# Patient Record
Sex: Female | Born: 1980 | Race: Asian | Hispanic: No | Marital: Married | Smoking: Never smoker
Health system: Southern US, Community
[De-identification: ages and names within clinical notes are randomized; demographics above are authoritative.]

## PROBLEM LIST (undated history)

## (undated) ENCOUNTER — Inpatient Hospital Stay (HOSPITAL_COMMUNITY): Payer: Self-pay

## (undated) DIAGNOSIS — D259 Leiomyoma of uterus, unspecified: Secondary | ICD-10-CM

## (undated) HISTORY — PX: OVARY SURGERY: SHX727

---

## 2016-01-02 ENCOUNTER — Encounter (HOSPITAL_COMMUNITY): Payer: Self-pay | Admitting: *Deleted

## 2016-01-02 ENCOUNTER — Inpatient Hospital Stay (HOSPITAL_COMMUNITY)
Admission: AD | Admit: 2016-01-02 | Discharge: 2016-01-02 | Disposition: A | Discharge status: Home / Self Care | Payer: Managed Care, Other (non HMO) | Source: Ambulatory Visit | Attending: Family Medicine | Admitting: Family Medicine

## 2016-01-02 ENCOUNTER — Inpatient Hospital Stay (HOSPITAL_COMMUNITY): Payer: Managed Care, Other (non HMO)

## 2016-01-02 DIAGNOSIS — O3680X0 Pregnancy with inconclusive fetal viability, not applicable or unspecified: Secondary | ICD-10-CM

## 2016-01-02 DIAGNOSIS — O26891 Other specified pregnancy related conditions, first trimester: Secondary | ICD-10-CM | POA: Diagnosis not present

## 2016-01-02 DIAGNOSIS — Z3A01 Less than 8 weeks gestation of pregnancy: Secondary | ICD-10-CM | POA: Insufficient documentation

## 2016-01-02 DIAGNOSIS — O3481 Maternal care for other abnormalities of pelvic organs, first trimester: Secondary | ICD-10-CM | POA: Diagnosis not present

## 2016-01-02 DIAGNOSIS — O26899 Other specified pregnancy related conditions, unspecified trimester: Secondary | ICD-10-CM

## 2016-01-02 DIAGNOSIS — E860 Dehydration: Secondary | ICD-10-CM

## 2016-01-02 DIAGNOSIS — R109 Unspecified abdominal pain: Secondary | ICD-10-CM

## 2016-01-02 DIAGNOSIS — N83201 Unspecified ovarian cyst, right side: Secondary | ICD-10-CM | POA: Diagnosis not present

## 2016-01-02 HISTORY — DX: Leiomyoma of uterus, unspecified: D25.9

## 2016-01-02 LAB — URINALYSIS, ROUTINE W REFLEX MICROSCOPIC
BILIRUBIN URINE: NEGATIVE
Glucose, UA: NEGATIVE mg/dL
HGB URINE DIPSTICK: NEGATIVE
Leukocytes, UA: NEGATIVE
Nitrite: NEGATIVE
PH: 5.5 (ref 5.0–8.0)
Protein, ur: NEGATIVE mg/dL

## 2016-01-02 LAB — CBC
HEMATOCRIT: 34.4 % — AB (ref 36.0–46.0)
Hemoglobin: 12 g/dL (ref 12.0–15.0)
MCH: 30.8 pg (ref 26.0–34.0)
MCHC: 34.9 g/dL (ref 30.0–36.0)
MCV: 88.2 fL (ref 78.0–100.0)
PLATELETS: 306 10*3/uL (ref 150–400)
RBC: 3.9 MIL/uL (ref 3.87–5.11)
RDW: 12.8 % (ref 11.5–15.5)
WBC: 6.7 10*3/uL (ref 4.0–10.5)

## 2016-01-02 LAB — POCT PREGNANCY, URINE: Preg Test, Ur: POSITIVE — AB

## 2016-01-02 LAB — HCG, QUANTITATIVE, PREGNANCY: hCG, Beta Chain, Quant, S: 615 m[IU]/mL — ABNORMAL HIGH (ref <5)

## 2016-01-02 MED ORDER — TRAMADOL HCL 50 MG PO TABS
50.0000 mg | ORAL_TABLET | Freq: Once | ORAL | Status: AC
  Administered 2016-01-02: 50 mg via ORAL
  Filled 2016-01-02: qty 1

## 2016-01-02 MED ORDER — LACTATED RINGERS IV BOLUS (SEPSIS)
1000.0000 mL | Freq: Once | INTRAVENOUS | Status: AC
  Administered 2016-01-02: 1000 mL via INTRAVENOUS

## 2016-01-02 NOTE — MAU Provider Note (Signed)
History     CSN: [id number]  Arrival date and time: 01/02/16 1654   First Provider Initiated Contact with Patient 01/02/16 1750      Chief Complaint  Patient presents with  . Abdominal Pain   HPI    [employer] interpretor use: patient is [place] speaking.   [name] is a 35 y.o. female G1P0 @ [redacted]w[redacted]d here with abdominal pain. The pain started 2 weeks ago. The pain comes and goes. The pain is located in the middle of her lower abdomen and in her RLQ. The patient was seen by [facility] today and was sent here for further evaluation.  She denies vaginal bleeding.   Patient rates her pain 7/10  OB History    Gravida Para Term Preterm AB Living   1             SAB TAB Ectopic Multiple Live Births                  Past Medical History:  Diagnosis Date  . Fibroid, uterine     Past Surgical History:  Procedure Laterality Date  . OVARY SURGERY      No family history on file.  Social History  Substance Use Topics  . Smoking status: Never Smoker  . Smokeless tobacco: Never Used  . Alcohol use No    Allergies: Allergies not on file  No prescriptions prior to admission.   Results for orders placed or performed during the hospital encounter of 01/02/16 (from the past 48 hour(s))  Urinalysis, Routine w reflex microscopic (not at [facility])     Status: Abnormal   Collection Time: 01/02/16  5:15 PM  Result Value Ref Range   Color, Urine YELLOW YELLOW   APPearance CLEAR CLEAR   Specific Gravity, Urine >1.030 (H) 1.005 - 1.030   pH 5.5 5.0 - 8.0   Glucose, UA NEGATIVE NEGATIVE mg/dL   Hgb urine dipstick NEGATIVE NEGATIVE   Bilirubin Urine NEGATIVE NEGATIVE   Ketones, ur >80 (A) NEGATIVE mg/dL   Protein, ur NEGATIVE NEGATIVE mg/dL   Nitrite NEGATIVE NEGATIVE   Leukocytes, UA NEGATIVE NEGATIVE    Comment: MICROSCOPIC NOT DONE ON URINES WITH NEGATIVE PROTEIN, BLOOD, LEUKOCYTES, NITRITE, OR GLUCOSE <1000 mg/dL.  Pregnancy, urine POC     Status: Abnormal    Collection Time: 01/02/16  5:23 PM  Result Value Ref Range   Preg Test, Ur POSITIVE (A) NEGATIVE    Comment:        THE SENSITIVITY OF THIS METHODOLOGY IS >24 mIU/mL   CBC     Status: Abnormal   Collection Time: 01/02/16  6:01 PM  Result Value Ref Range   WBC 6.7 4.0 - 10.5 K/uL   RBC 3.90 3.87 - 5.11 MIL/uL   Hemoglobin 12.0 12.0 - 15.0 g/dL   HCT 34.4 (L) 36.0 - 46.0 %   MCV 88.2 78.0 - 100.0 fL   MCH 30.8 26.0 - 34.0 pg   MCHC 34.9 30.0 - 36.0 g/dL   RDW 12.8 11.5 - 15.5 %   Platelets 306 150 - 400 K/uL  ABO/Rh     Status: None (Preliminary result)   Collection Time: 01/02/16  6:01 PM  Result Value Ref Range   ABO/RH(D) O POS   hCG, quantitative, pregnancy     Status: Abnormal   Collection Time: 01/02/16  6:01 PM  Result Value Ref Range   hCG, Beta Chain, Quant, S 615 (H) <5 mIU/mL    Comment:  GEST. AGE      CONC.  (mIU/mL)   <=1 WEEK        5 - 50     2 WEEKS       50 - 500     3 WEEKS       100 - 10,000     4 WEEKS     1,000 - 30,000     5 WEEKS     3,500 - 115,000   6-8 WEEKS     12,000 - 270,000    12 WEEKS     15,000 - 220,000        FEMALE AND NON-PREGNANT FEMALE:     LESS THAN 5 mIU/mL    US Ob Comp Less 14 Wks  Result Date: 01/02/2016 CLINICAL DATA:  Pelvic pain on the right EXAM: OBSTETRIC <14 WK [place] AND TRANSVAGINAL OB US TECHNIQUE: Both transabdominal and transvaginal ultrasound examinations were performed for complete evaluation of the gestation as well as the maternal uterus, adnexal regions, and pelvic cul-de-sac. Transvaginal technique was performed to assess early pregnancy. COMPARISON:  None. FINDINGS: Intrauterine gestational sac: Absent Maternal uterus/adnexae: Endometrial thickening is noted consistent with positive pregnancy test. The left ovary is within normal limits. The right ovary is enlarged with enlarged 3.5 cm cyst within. IMPRESSION: Prominent right ovarian cyst. Thickening of the endometrium consistent with a positive pregnancy test  although no gestational sac is noted at this time. Recommend follow-up quantitative B-HCG levels and follow-up US in 14 days as clinically indicated. This recommendation follows SRU consensus guidelines: Diagnostic Criteria for Nonviable Pregnancy Early in the First Trimester. [place] Med [id number]:705707. Electronically Signed   By: Inez Catalina M.D.   On: 01/02/2016 19:53   US Ob Transvaginal  Result Date: 01/02/2016 CLINICAL DATA:  Pelvic pain on the right EXAM: OBSTETRIC <14 WK [place] AND TRANSVAGINAL OB US TECHNIQUE: Both transabdominal and transvaginal ultrasound examinations were performed for complete evaluation of the gestation as well as the maternal uterus, adnexal regions, and pelvic cul-de-sac. Transvaginal technique was performed to assess early pregnancy. COMPARISON:  None. FINDINGS: Intrauterine gestational sac: Absent Maternal uterus/adnexae: Endometrial thickening is noted consistent with positive pregnancy test. The left ovary is within normal limits. The right ovary is enlarged with enlarged 3.5 cm cyst within. IMPRESSION: Prominent right ovarian cyst. Thickening of the endometrium consistent with a positive pregnancy test although no gestational sac is noted at this time. Recommend follow-up quantitative B-HCG levels and follow-up US in 14 days as clinically indicated. This recommendation follows SRU consensus guidelines: Diagnostic Criteria for Nonviable Pregnancy Early in the First Trimester. [employer] Med [id number]. Electronically Signed   By: Inez Catalina M.D.   On: 01/02/2016 19:53    Review of Systems  Constitutional: Negative for chills and fever.  Gastrointestinal: Positive for abdominal pain. Negative for constipation, nausea and vomiting.  Genitourinary: Negative for dysuria.   Physical Exam   Blood pressure 122/75, pulse (!) 56, temperature 97.4 F (36.3 C), temperature source Oral, resp. rate 16, last menstrual period 11/22/2015.  Physical Exam   Constitutional: She is oriented to person, place, and time. She appears well-developed and well-nourished. No distress.  GI: Soft. Normal appearance. There is tenderness in the right lower quadrant and suprapubic area. There is no rigidity, no rebound and no guarding.  Musculoskeletal: Normal range of motion.  Neurological: She is alert and oriented to person, place, and time.  Skin: She is not diaphoretic.    MAU  Course  Procedures  None  MDM  Urine shows >80 ketones LR bolus ordered US CBC, Quant, and ABO  Tramadol 50 mg PO given  Discussed [place] and labs results with the patient using [place] interpretor   Report given to [place] who resumes care of the patient.   Assessment and Plan   A:  1. Pregnancy of unknown anatomic location   2. Abdominal pain in pregnancy, antepartum   3. Severe dehydration   4. Cyst of right ovary    DC home Comfort measures reviewed  1st Trimester precautions  Bleeding precautions Ectopic precautions RX: none  Return to MAU as needed FU with OB as planned  [facility] .   Specialty:  Obstetrics and Gynecology Why:  01/04/16 at 0800  Contact information: [facility] [place] [phone]

## 2016-01-02 NOTE — Discharge Instructions (Signed)
First Trimester of Pregnancy The first trimester of pregnancy is from week 1 until the end of week 12 (months 1 through 3). A week after a sperm fertilizes an egg, the egg will implant on the wall of the uterus. This embryo will begin to develop into a baby. Genes from you and your partner are forming the baby. The female genes determine whether the baby is a boy or a girl. At 6-8 weeks, the eyes and face are formed, and the heartbeat can be seen on ultrasound. At the end of 12 weeks, all the baby's organs are formed.  Now that you are pregnant, you will want to do everything you can to have a healthy baby. Two of the most important things are to get good prenatal care and to follow your health care provider's instructions. Prenatal care is all the medical care you receive before the baby's birth. This care will help prevent, find, and treat any problems during the pregnancy and childbirth. BODY CHANGES Your body goes through many changes during pregnancy. The changes vary from woman to woman.   You may gain or lose a couple of pounds at first.  You may feel sick to your stomach (nauseous) and throw up (vomit). If the vomiting is uncontrollable, call your health care provider.  You may tire easily.  You may develop headaches that can be relieved by medicines approved by your health care provider.  You may urinate more often. Painful urination may mean you have a bladder infection.  You may develop heartburn as a result of your pregnancy.  You may develop constipation because certain hormones are causing the muscles that push waste through your intestines to slow down.  You may develop hemorrhoids or swollen, bulging veins (varicose veins).  Your breasts may begin to grow larger and become tender. Your nipples may stick out more, and the tissue that surrounds them (areola) may become darker.  Your gums may bleed and may be sensitive to brushing and flossing.  Dark spots or blotches (chloasma,  mask of pregnancy) may develop on your face. This will likely fade after the baby is born.  Your menstrual periods will stop.  You may have a loss of appetite.  You may develop cravings for certain kinds of food.  You may have changes in your emotions from day to day, such as being excited to be pregnant or being concerned that something may go wrong with the pregnancy and baby.  You may have more vivid and strange dreams.  You may have changes in your hair. These can include thickening of your hair, rapid growth, and changes in texture. Some women also have hair loss during or after pregnancy, or hair that feels dry or thin. Your hair will most likely return to normal after your baby is born. WHAT TO EXPECT AT YOUR PRENATAL VISITS During a routine prenatal visit:  You will be weighed to make sure you and the baby are growing normally.  Your blood pressure will be taken.  Your abdomen will be measured to track your baby's growth.  The fetal heartbeat will be listened to starting around week 10 or 12 of your pregnancy.  Test results from any previous visits will be discussed. Your health care provider may ask you:  How you are feeling.  If you are feeling the baby move.  If you have had any abnormal symptoms, such as leaking fluid, bleeding, severe headaches, or abdominal cramping.  If you are using any tobacco products,   including cigarettes, chewing tobacco, and electronic cigarettes.  If you have any questions. Other tests that may be performed during your first trimester include:  Blood tests to find your blood type and to check for the presence of any previous infections. They will also be used to check for low iron levels (anemia) and Rh antibodies. Later in the pregnancy, blood tests for diabetes will be done along with other tests if problems develop.  Urine tests to check for infections, diabetes, or protein in the urine.  An ultrasound to confirm the proper growth  and development of the baby.  An amniocentesis to check for possible genetic problems.  Fetal screens for spina bifida and Down syndrome.  You may need other tests to make sure you and the baby are doing well.  HIV (human immunodeficiency virus) testing. Routine prenatal testing includes screening for HIV, unless you choose not to have this test. HOME CARE INSTRUCTIONS  Medicines  Follow your health care provider's instructions regarding medicine use. Specific medicines may be either safe or unsafe to take during pregnancy.  Take your prenatal vitamins as directed.  If you develop constipation, try taking a stool softener if your health care provider approves. Diet  Eat regular, well-balanced meals. Choose a variety of foods, such as meat or vegetable-based protein, fish, milk and low-fat dairy products, vegetables, fruits, and whole grain breads and cereals. Your health care provider will help you determine the amount of weight gain that is right for you.  Avoid raw meat and uncooked cheese. These carry germs that can cause birth defects in the baby.  Eating four or five small meals rather than three large meals a day may help relieve nausea and vomiting. If you start to feel nauseous, eating a few soda crackers can be helpful. Drinking liquids between meals instead of during meals also seems to help nausea and vomiting.  If you develop constipation, eat more high-fiber foods, such as fresh vegetables or fruit and whole grains. Drink enough fluids to keep your urine clear or pale yellow. Activity and Exercise  Exercise only as directed by your health care provider. Exercising will help you:  Control your weight.  Stay in shape.  Be prepared for labor and delivery.  Experiencing pain or cramping in the lower abdomen or low back is a good sign that you should stop exercising. Check with your health care provider before continuing normal exercises.  Try to avoid standing for long  periods of time. Move your legs often if you must stand in one place for a long time.  Avoid heavy lifting.  Wear low-heeled shoes, and practice good posture.  You may continue to have sex unless your health care provider directs you otherwise. Relief of Pain or Discomfort  Wear a good support bra for breast tenderness.   Take warm sitz baths to soothe any pain or discomfort caused by hemorrhoids. Use hemorrhoid cream if your health care provider approves.   Rest with your legs elevated if you have leg cramps or low back pain.  If you develop varicose veins in your legs, wear support hose. Elevate your feet for 15 minutes, 3-4 times a day. Limit salt in your diet. Prenatal Care  Schedule your prenatal visits by the twelfth week of pregnancy. They are usually scheduled monthly at first, then more often in the last 2 months before delivery.  Write down your questions. Take them to your prenatal visits.  Keep all your prenatal visits as directed by your   health care provider. Safety  Wear your seat belt at all times when driving.  Make a list of emergency phone numbers, including numbers for family, friends, the hospital, and police and fire departments. General Tips  Ask your health care provider for a referral to a local prenatal education class. Begin classes no later than at the beginning of month 6 of your pregnancy.  Ask for help if you have counseling or nutritional needs during pregnancy. Your health care provider can offer advice or refer you to specialists for help with various needs.  Do not use hot tubs, steam rooms, or saunas.  Do not douche or use tampons or scented sanitary pads.  Do not cross your legs for long periods of time.  Avoid cat litter boxes and soil used by cats. These carry germs that can cause birth defects in the baby and possibly loss of the fetus by miscarriage or stillbirth.  Avoid all smoking, herbs, alcohol, and medicines not prescribed by  your health care provider. Chemicals in these affect the formation and growth of the baby.  Do not use any tobacco products, including cigarettes, chewing tobacco, and electronic cigarettes. If you need help quitting, ask your health care provider. You may receive counseling support and other resources to help you quit.  Schedule a dentist appointment. At home, brush your teeth with a soft toothbrush and be gentle when you floss. SEEK MEDICAL CARE IF:   You have dizziness.  You have mild pelvic cramps, pelvic pressure, or nagging pain in the abdominal area.  You have persistent nausea, vomiting, or diarrhea.  You have a bad smelling vaginal discharge.  You have pain with urination.  You notice increased swelling in your face, hands, legs, or ankles. SEEK IMMEDIATE MEDICAL CARE IF:   You have a fever.  You are leaking fluid from your vagina.  You have spotting or bleeding from your vagina.  You have severe abdominal cramping or pain.  You have rapid weight gain or loss.  You vomit blood or material that looks like coffee grounds.  You are exposed to German measles and have never had them.  You are exposed to fifth disease or chickenpox.  You develop a severe headache.  You have shortness of breath.  You have any kind of trauma, such as from a fall or a car accident.   This information is not intended to replace advice given to you by your health care provider. Make sure you discuss any questions you have with your health care provider.   Document Released: 02/27/2001 Document Revised: 03/26/2014 Document Reviewed: 01/13/2013 Elsevier Interactive Patient Education 2016 Elsevier Inc.  

## 2016-01-02 NOTE — MAU Note (Signed)
C/o abdominal pain for a week; denies any bleeding;

## 2016-01-03 LAB — ABO/RH: ABO/RH(D): O POS

## 2016-01-03 LAB — HIV ANTIBODY (ROUTINE TESTING W REFLEX): HIV Screen 4th Generation wRfx: NONREACTIVE

## 2016-01-04 ENCOUNTER — Telehealth: Payer: Self-pay | Admitting: General Practice

## 2016-01-04 ENCOUNTER — Other Ambulatory Visit: Payer: Managed Care, Other (non HMO) | Admitting: General Practice

## 2016-01-04 DIAGNOSIS — O3680X0 Pregnancy with inconclusive fetal viability, not applicable or unspecified: Secondary | ICD-10-CM

## 2016-01-04 LAB — HCG, QUANTITATIVE, PREGNANCY: hCG, Beta Chain, Quant, S: 999 m[IU]/mL — ABNORMAL HIGH (ref <5)

## 2016-01-04 NOTE — Telephone Encounter (Signed)
Called patient with [employer] interpreter [phone] and informed her of bhcg results and recommendations. Patient verbalized understanding and states she can be here next week at 10/25 @ 9am. Patient knows to report to MAU for bleeding or pain.

## 2016-01-04 NOTE — Progress Notes (Signed)
Used [employer] interpreter [phone] [place] interpreter for today's encounter. Patient here for stat bhcg. Patient reports no pain since Monday and no bleeding. Told patient results take 2 hours to come back. Told patient to expect a call from me around 10:30am. Patient verbalized understanding & provided number [phone]. Per Dr Ihor Dow, rise in bhcg. Needs repeat level in one week- ordered under her. Will call patient.

## 2016-01-11 ENCOUNTER — Other Ambulatory Visit: Payer: Managed Care, Other (non HMO)

## 2016-01-11 DIAGNOSIS — Z3490 Encounter for supervision of normal pregnancy, unspecified, unspecified trimester: Secondary | ICD-10-CM

## 2016-01-11 NOTE — Progress Notes (Unsigned)
Used [employer] interpreter [phone], patient states she has had some right lower quadrant pain & lower back pain but has felt fine today. Patient denies bleeding. Informed patient results should be back tomorrow & we will give her a call. Patient had no questions

## 2016-01-12 LAB — HCG, QUANTITATIVE, PREGNANCY: hCG, Beta Chain, Quant, S: 11798.3 m[IU]/mL — ABNORMAL HIGH

## 2016-01-26 ENCOUNTER — Telehealth: Payer: Self-pay

## 2016-01-26 NOTE — Telephone Encounter (Signed)
Per Dr. Ihor Dow, follow up with pt in regards to receiving prenatal care and schedule [place] to confirm IUP.  Contacted pt with [employer] # [id number] and clarified with pt if she has set up prenatal care.  Pt stated that she has an appt scheduled with [facility] tomorrow, 01/27/16.  I advised pt that they will determine follow up at her visit tomorrow.  I verified with x 2 that she has an appt with [facility].  Pt stated yes thank you and had no further questions.

## 2016-01-30 ENCOUNTER — Telehealth: Payer: Self-pay | Admitting: Obstetrics and Gynecology

## 2016-01-30 ENCOUNTER — Encounter (HOSPITAL_COMMUNITY): Payer: Self-pay

## 2016-01-30 ENCOUNTER — Inpatient Hospital Stay (HOSPITAL_COMMUNITY)
Admission: AD | Admit: 2016-01-30 | Discharge: 2016-01-30 | Disposition: A | Discharge status: Home / Self Care | Payer: Managed Care, Other (non HMO) | Source: Ambulatory Visit | Attending: Family Medicine | Admitting: Family Medicine

## 2016-01-30 DIAGNOSIS — O26891 Other specified pregnancy related conditions, first trimester: Secondary | ICD-10-CM | POA: Diagnosis not present

## 2016-01-30 DIAGNOSIS — O219 Vomiting of pregnancy, unspecified: Secondary | ICD-10-CM | POA: Insufficient documentation

## 2016-01-30 DIAGNOSIS — R109 Unspecified abdominal pain: Secondary | ICD-10-CM | POA: Diagnosis not present

## 2016-01-30 DIAGNOSIS — Z3A09 9 weeks gestation of pregnancy: Secondary | ICD-10-CM | POA: Insufficient documentation

## 2016-01-30 LAB — URINALYSIS, ROUTINE W REFLEX MICROSCOPIC
Bilirubin Urine: NEGATIVE
GLUCOSE, UA: NEGATIVE mg/dL
Hgb urine dipstick: NEGATIVE
KETONES UR: 15 mg/dL — AB
LEUKOCYTES UA: NEGATIVE
Nitrite: NEGATIVE
PH: 7.5 (ref 5.0–8.0)
Protein, ur: NEGATIVE mg/dL
Specific Gravity, Urine: 1.015 (ref 1.005–1.030)

## 2016-01-30 MED ORDER — DOXYLAMINE-PYRIDOXINE 10-10 MG PO TBEC: 1.0000 | DELAYED_RELEASE_TABLET | Freq: Four times a day (QID) | ORAL | 3 refills | Status: AC | PRN

## 2016-01-30 NOTE — Discharge Instructions (Signed)
Abdominal Pain During Pregnancy Abdominal pain is common in pregnancy. Most of the time, it does not cause harm. There are many causes of abdominal pain. Some causes are more serious than others. Some of the causes of abdominal pain in pregnancy are easily diagnosed. Occasionally, the diagnosis takes time to understand. Other times, the cause is not determined. Abdominal pain can be a sign that something is very wrong with the pregnancy, or the pain may have nothing to do with the pregnancy at all. For this reason, always tell your health care provider if you have any abdominal discomfort. HOME CARE INSTRUCTIONS  Monitor your abdominal pain for any changes. The following actions may help to alleviate any discomfort you are experiencing:  Do not have sexual intercourse or put anything in your vagina until your symptoms go away completely.  Get plenty of rest until your pain improves.  Drink clear fluids if you feel nauseous. Avoid solid food as long as you are uncomfortable or nauseous.  Only take over-the-counter or prescription medicine as directed by your health care provider.  Keep all follow-up appointments with your health care provider. SEEK IMMEDIATE MEDICAL CARE IF:  You are bleeding, leaking fluid, or passing tissue from the vagina.  You have increasing pain or cramping.  You have persistent vomiting.  You have painful or bloody urination.  You have a fever.  You notice a decrease in your baby's movements.  You have extreme weakness or feel faint.  You have shortness of breath, with or without abdominal pain.  You develop a severe headache with abdominal pain.  You have abnormal vaginal discharge with abdominal pain.  You have persistent diarrhea.  You have abdominal pain that continues even after rest, or gets worse. MAKE SURE YOU:   Understand these instructions.  Will watch your condition.  Will get help right away if you are not doing well or get worse.     This information is not intended to replace advice given to you by your health care provider. Make sure you discuss any questions you have with your health care provider.   Document Released: 03/05/2005 Document Revised: 12/24/2012 Document Reviewed: 10/02/2012 Elsevier Interactive Patient Education 2016 [employer]. ?????? ?????????????????????????????????????????????????????????????????????????????????????????????????????????????????????????????????????????????????????????????????????????????????????????????????????????????????????????????????????? ??????? ??????????????????????????????????????????????????????????????? ????????????????????????????????????????? ????????????????????????? ?????????????????????????????????????????????????????? ???????????????????????????????????? ???????????????????????????????? IF???????????? ????????????????????????????????????? ????????????? ???????????????? ?????????????????? ??????????? ???????????????????????? ?????????????????????????? ????????????????????? ????????????????????? ???????????????????????? ????????????????? ?????????????????????? ????????????? ???????????? ????????????? ?????????????????????????????????????  ????????????????????????????????????????????????????????????????????????????????????????  ????????????03/05/2005?????????2014?10?8????????????2014?7?17? Elsevier????????????2016 [employer].

## 2016-01-30 NOTE — MAU Note (Signed)
Pt C/O lower abd pain since last night, feels like a pin sticking.  Has been vomiting every day for the last 3 weeks, denies diarrhea.  No bleeding.

## 2016-01-30 NOTE — Telephone Encounter (Signed)
Patient called in this morning to schedule a New Patient appt, she is 9 weeks,  I told the patient we had an appt on 02-21-2016 at 12:40pm patient speak [place] and we have to use two appts slots for non speaking English Patients,  Patient wanted something sooner and I expressed to her that was the first appt.   I gave the patient to other office where she may can get a sooner Appt.   Patient husband called back and said she need to be seen this week I expressed to him we had no open aptts for New Patients this week, I put him on hold to find another appt, I moved a patient that was already in a Time slot to get a Appt sooner, That Appt is on 02-16-2016, her husband said that he will take her to ER  Because she was having pain I advised him if she is having pain that the ED will be the best place to take her until she can get into her appt with [place] on 11-30, again I hade to use to appt slots for her due to she speak  [place].

## 2016-01-30 NOTE — MAU Provider Note (Signed)
[facility] Attending MAU Note  Chief Complaint: Abdominal Pain and Emesis During Pregnancy    None     SUBJECTIVE Sara Burke is a 35 y.o. G1P0 at [redacted]w[redacted]d by LMP who presents with midline pain x 1 day. Notes it comes and goes. She speaks [place] and video interpreter used. She denies Fever, vomiting. No diarrhea or constipation. She does report nausea and chills. Also with cough. She has not been happy with inability to get an appointment. She has had appropriately rising BHCG, but no actual confirmatory u/s.  Past Medical History:  Diagnosis Date  . Fibroid, uterine    OB History  Gravida Para Term Preterm AB Living  1            SAB TAB Ectopic Multiple Live Births               # Outcome Date GA Lbr Len/2nd Weight Sex Delivery Anes PTL Lv  1 Current              Past Surgical History:  Procedure Laterality Date  . OVARY SURGERY     Social History   Social History  . Marital status: Married    Spouse name: N/A  . Number of children: N/A  . Years of education: N/A   Occupational History  . Not on file.   Social History Main Topics  . Smoking status: Never Smoker  . Smokeless tobacco: Never Used  . Alcohol use No  . Drug use: No  . Sexual activity: Not on file   Other Topics Concern  . Not on file   Social History Narrative  . No narrative on file   No current facility-administered medications on file prior to encounter.    No current outpatient prescriptions on file prior to encounter.   No Known Allergies  ROS: Pertinent items in HPI  OBJECTIVE BP 104/67 (BP Location: Left Arm)   Pulse (!) 58   Temp 97.3 F (36.3 C) (Oral)   Resp 16   Wt 145 lb (65.8 kg)   LMP 11/22/2015  CONSTITUTIONAL: Well-developed, well-nourished female in no acute distress.  HENT:  Normocephalic, atraumatic, Oropharynx is clear and moist EYES: Conjunctivae and EOM are normal. . No scleral icterus.  NECK: Normal range of motion, supple, no masses.  Normal  thyroid.  SKIN: Skin is warm and dry. No rash noted. Not diaphoretic. No erythema. No pallor. [place]: Alert and oriented to person, place, and time.  No cranial nerve deficit noted. PSYCHIATRIC: Normal mood and affect. Normal behavior. Normal judgment and thought content. CARDIOVASCULAR: Normal heart rate noted RESPIRATORY: Effort normal, no problems with respiration noted. ABDOMEN: Soft, normal bowel sounds, no distention noted.  No tenderness, rebound or guarding.  PELVIC: Normal appearing external genitalia MUSCULOSKELETAL: Normal range of motion. No tenderness.  No cyanosis, clubbing, or edema.  2+ distal pulses.  LAB RESULTS Results for orders placed or performed during the hospital encounter of 01/30/16 (from the past 48 hour(s))  Urinalysis, Routine w reflex microscopic (not at [facility])     Status: Abnormal   Collection Time: 01/30/16  4:25 PM  Result Value Ref Range   Color, Urine YELLOW YELLOW   APPearance HAZY (A) CLEAR   Specific Gravity, Urine 1.015 1.005 - 1.030   pH 7.5 5.0 - 8.0   Glucose, UA NEGATIVE NEGATIVE mg/dL   Hgb urine dipstick NEGATIVE NEGATIVE   Bilirubin Urine NEGATIVE NEGATIVE   Ketones, ur 15 (A) NEGATIVE mg/dL   Protein, ur  NEGATIVE NEGATIVE mg/dL   Nitrite NEGATIVE NEGATIVE   Leukocytes, UA NEGATIVE NEGATIVE    Comment: MICROSCOPIC NOT DONE ON URINES WITH NEGATIVE PROTEIN, BLOOD, LEUKOCYTES, NITRITE, OR GLUCOSE <1000 mg/dL.    IMAGING US Ob Comp Less 14 Wks  Result Date: 01/02/2016 CLINICAL DATA:  Pelvic pain on the right EXAM: OBSTETRIC <14 WK [place] AND TRANSVAGINAL OB US TECHNIQUE: Both transabdominal and transvaginal ultrasound examinations were performed for complete evaluation of the gestation as well as the maternal uterus, adnexal regions, and pelvic cul-de-sac. Transvaginal technique was performed to assess early pregnancy. COMPARISON:  None. FINDINGS: Intrauterine gestational sac: Absent Maternal uterus/adnexae: Endometrial thickening is noted  consistent with positive pregnancy test. The left ovary is within normal limits. The right ovary is enlarged with enlarged 3.5 cm cyst within. IMPRESSION: Prominent right ovarian cyst. Thickening of the endometrium consistent with a positive pregnancy test although no gestational sac is noted at this time. Recommend follow-up quantitative B-HCG levels and follow-up US in 14 days as clinically indicated. This recommendation follows SRU consensus guidelines: Diagnostic Criteria for Nonviable Pregnancy Early in the First Trimester. [place] Med 2013KT:[id number]. Electronically Signed   By: Inez Catalina M.D.   On: 01/02/2016 19:53   US Ob Transvaginal  Result Date: 01/02/2016 CLINICAL DATA:  Pelvic pain on the right EXAM: OBSTETRIC <14 WK [place] AND TRANSVAGINAL OB US TECHNIQUE: Both transabdominal and transvaginal ultrasound examinations were performed for complete evaluation of the gestation as well as the maternal uterus, adnexal regions, and pelvic cul-de-sac. Transvaginal technique was performed to assess early pregnancy. COMPARISON:  None. FINDINGS: Intrauterine gestational sac: Absent Maternal uterus/adnexae: Endometrial thickening is noted consistent with positive pregnancy test. The left ovary is within normal limits. The right ovary is enlarged with enlarged 3.5 cm cyst within. IMPRESSION: Prominent right ovarian cyst. Thickening of the endometrium consistent with a positive pregnancy test although no gestational sac is noted at this time. Recommend follow-up quantitative B-HCG levels and follow-up US in 14 days as clinically indicated. This recommendation follows SRU consensus guidelines: Diagnostic Criteria for Nonviable Pregnancy Early in the First Trimester. [employer] Med 2013KT:048977. Electronically Signed   By: Inez Catalina M.D.   On: 01/02/2016 19:53    MAU COURSE Bedside u/s done, with SIUP noted and + flicker  ASSESSMENT 1. Abdominal pain in pregnancy, first trimester   No evidence of  ectopic pregnancy  PLAN Discharge home Keep next scheduled appointment for New OB--interested in flu shot and NIPT Schedule outpatient u/s for dating.    Medication List    TAKE these medications   Doxylamine-Pyridoxine 10-10 MG Tbec Take 1 tablet by mouth 4 (four) times daily as needed.   prenatal multivitamin Tabs tablet Take 1 tablet by mouth daily at 12 noon.        Donnamae Jude, MD 01/30/2016 7:28 PM

## 2016-02-06 ENCOUNTER — Encounter (INDEPENDENT_AMBULATORY_CARE_PROVIDER_SITE_OTHER): Payer: Self-pay

## 2016-02-06 ENCOUNTER — Ambulatory Visit (HOSPITAL_COMMUNITY)
Admission: RE | Admit: 2016-02-06 | Discharge: 2016-02-06 | Disposition: A | Discharge status: Home / Self Care | Payer: Managed Care, Other (non HMO) | Source: Ambulatory Visit | Attending: Family Medicine | Admitting: Family Medicine

## 2016-02-06 ENCOUNTER — Other Ambulatory Visit (HOSPITAL_COMMUNITY): Payer: Self-pay | Admitting: Family Medicine

## 2016-02-06 DIAGNOSIS — O26891 Other specified pregnancy related conditions, first trimester: Secondary | ICD-10-CM | POA: Diagnosis present

## 2016-02-06 DIAGNOSIS — Z3A09 9 weeks gestation of pregnancy: Secondary | ICD-10-CM | POA: Diagnosis not present

## 2016-02-06 DIAGNOSIS — R109 Unspecified abdominal pain: Principal | ICD-10-CM

## 2016-02-16 ENCOUNTER — Ambulatory Visit (INDEPENDENT_AMBULATORY_CARE_PROVIDER_SITE_OTHER): Payer: Managed Care, Other (non HMO) | Admitting: Obstetrics and Gynecology

## 2016-02-16 ENCOUNTER — Encounter: Payer: Self-pay | Admitting: Obstetrics and Gynecology

## 2016-02-16 VITALS — BP 101/71 | HR 63 | Ht 64.0 in | Wt 141.0 lb

## 2016-02-16 DIAGNOSIS — Z789 Other specified health status: Secondary | ICD-10-CM

## 2016-02-16 DIAGNOSIS — Z124 Encounter for screening for malignant neoplasm of cervix: Secondary | ICD-10-CM

## 2016-02-16 DIAGNOSIS — Z23 Encounter for immunization: Secondary | ICD-10-CM

## 2016-02-16 DIAGNOSIS — Z113 Encounter for screening for infections with a predominantly sexual mode of transmission: Secondary | ICD-10-CM | POA: Diagnosis not present

## 2016-02-16 DIAGNOSIS — Z1151 Encounter for screening for human papillomavirus (HPV): Secondary | ICD-10-CM | POA: Diagnosis not present

## 2016-02-16 DIAGNOSIS — Z3491 Encounter for supervision of normal pregnancy, unspecified, first trimester: Secondary | ICD-10-CM

## 2016-02-16 DIAGNOSIS — Z3490 Encounter for supervision of normal pregnancy, unspecified, unspecified trimester: Secondary | ICD-10-CM | POA: Insufficient documentation

## 2016-02-16 NOTE — Patient Instructions (Signed)
Morning Sickness Morning sickness is when you feel sick to your stomach (nauseous) during pregnancy. This nauseous feeling may or may not come with vomiting. It often occurs in the morning but can be a problem any time of day. Morning sickness is most common during the first trimester, but it may continue throughout pregnancy. While morning sickness is unpleasant, it is usually harmless unless you develop severe and continual vomiting (hyperemesis gravidarum). This condition requires more intense treatment. What are the causes? The cause of morning sickness is not completely known but seems to be related to normal hormonal changes that occur in pregnancy. What increases the risk? You are at greater risk if you:  Experienced nausea or vomiting before your pregnancy.  Had morning sickness during a previous pregnancy.  Are pregnant with more than one baby, such as twins. How is this treated? Do not use any medicines (prescription, over-the-counter, or herbal) for morning sickness without first talking to your health care provider. Your health care provider may prescribe or recommend:  Vitamin B6 supplements.  Anti-nausea medicines.  The herbal medicine ginger. Follow these instructions at home:  Only take over-the-counter or prescription medicines as directed by your health care provider.  Taking multivitamins before getting pregnant can prevent or decrease the severity of morning sickness in most women.  Eat a piece of dry toast or unsalted crackers before getting out of bed in the morning.  Eat five or six small meals a day.  Eat dry and bland foods (rice, baked potato). Foods high in carbohydrates are often helpful.  Do not drink liquids with your meals. Drink liquids between meals.  Avoid greasy, fatty, and spicy foods.  Get someone to cook for you if the smell of any food causes nausea and vomiting.  If you feel nauseous after taking prenatal vitamins, take the vitamins at  night or with a snack.  Snack on protein foods (nuts, yogurt, cheese) between meals if you are hungry.  Eat unsweetened gelatins for desserts.  Wearing an acupressure wristband (worn for sea sickness) may be helpful.  Acupuncture may be helpful.  Do not smoke.  Get a humidifier to keep the air in your house free of odors.  Get plenty of fresh air. Contact a health care provider if:  Your home remedies are not working, and you need medicine.  You feel dizzy or lightheaded.  You are losing weight. Get help right away if:  You have persistent and uncontrolled nausea and vomiting.  You pass out (faint). This information is not intended to replace advice given to you by your health care provider. Make sure you discuss any questions you have with your health care provider. Document Released: 04/26/2006 Document Revised: 08/11/2015 Document Reviewed: 08/20/2012 Elsevier Interactive Patient Education  2017 [place] of Pregnancy The first trimester of pregnancy is from week 1 until the end of week 12 (months 1 through 3). A week after a sperm fertilizes an egg, the egg will implant on the wall of the uterus. This embryo will begin to develop into a baby. Genes from you and your partner are forming the baby. The female genes determine whether the baby is a boy or a girl. At 6-8 weeks, the eyes and face are formed, and the heartbeat can be seen on ultrasound. At the end of 12 weeks, all the baby's organs are formed.  Now that you are pregnant, you will want to do everything you can to have a healthy baby. Two of the  most important things are to get good prenatal care and to follow your health care provider's instructions. Prenatal care is all the medical care you receive before the baby's birth. This care will help prevent, find, and treat any problems during the pregnancy and childbirth. BODY CHANGES Your body goes through many changes during pregnancy. The changes vary  from woman to woman.   You may gain or lose a couple of pounds at first.  You may feel sick to your stomach (nauseous) and throw up (vomit). If the vomiting is uncontrollable, call your health care provider.  You may tire easily.  You may develop headaches that can be relieved by medicines approved by your health care provider.  You may urinate more often. Painful urination may mean you have a bladder infection.  You may develop heartburn as a result of your pregnancy.  You may develop constipation because certain hormones are causing the muscles that push waste through your intestines to slow down.  You may develop hemorrhoids or swollen, bulging veins (varicose veins).  Your breasts may begin to grow larger and become tender. Your nipples may stick out more, and the tissue that surrounds them (areola) may become darker.  Your gums may bleed and may be sensitive to brushing and flossing.  Dark spots or blotches (chloasma, mask of pregnancy) may develop on your face. This will likely fade after the baby is born.  Your menstrual periods will stop.  You may have a loss of appetite.  You may develop cravings for certain kinds of food.  You may have changes in your emotions from day to day, such as being excited to be pregnant or being concerned that something may go wrong with the pregnancy and baby.  You may have more vivid and strange dreams.  You may have changes in your hair. These can include thickening of your hair, rapid growth, and changes in texture. Some women also have hair loss during or after pregnancy, or hair that feels dry or thin. Your hair will most likely return to normal after your baby is born. WHAT TO EXPECT AT YOUR PRENATAL VISITS During a routine prenatal visit:  You will be weighed to make sure you and the baby are growing normally.  Your blood pressure will be taken.  Your abdomen will be measured to track your baby's growth.  The fetal heartbeat  will be listened to starting around week 10 or 12 of your pregnancy.  Test results from any previous visits will be discussed. Your health care provider may ask you:  How you are feeling.  If you are feeling the baby move.  If you have had any abnormal symptoms, such as leaking fluid, bleeding, severe headaches, or abdominal cramping.  If you are using any tobacco products, including cigarettes, chewing tobacco, and electronic cigarettes.  If you have any questions. Other tests that may be performed during your first trimester include:  Blood tests to find your blood type and to check for the presence of any previous infections. They will also be used to check for low iron levels (anemia) and Rh antibodies. Later in the pregnancy, blood tests for diabetes will be done along with other tests if problems develop.  Urine tests to check for infections, diabetes, or protein in the urine.  An ultrasound to confirm the proper growth and development of the baby.  An amniocentesis to check for possible genetic problems.  Fetal screens for spina bifida and Down syndrome.  You may need  other tests to make sure you and the baby are doing well.  HIV (human immunodeficiency virus) testing. Routine prenatal testing includes screening for HIV, unless you choose not to have this test. HOME CARE INSTRUCTIONS  Medicines   Follow your health care provider's instructions regarding medicine use. Specific medicines may be either safe or unsafe to take during pregnancy.  Take your prenatal vitamins as directed.  If you develop constipation, try taking a stool softener if your health care provider approves. Diet   Eat regular, well-balanced meals. Choose a variety of foods, such as meat or vegetable-based protein, fish, milk and low-fat dairy products, vegetables, fruits, and whole grain breads and cereals. Your health care provider will help you determine the amount of weight gain that is right for  you.  Avoid raw meat and uncooked cheese. These carry germs that can cause birth defects in the baby.  Eating four or five small meals rather than three large meals a day may help relieve nausea and vomiting. If you start to feel nauseous, eating a few soda crackers can be helpful. Drinking liquids between meals instead of during meals also seems to help nausea and vomiting.  If you develop constipation, eat more high-fiber foods, such as fresh vegetables or fruit and whole grains. Drink enough fluids to keep your urine clear or pale yellow. Activity and Exercise   Exercise only as directed by your health care provider. Exercising will help you:  Control your weight.  Stay in shape.  Be prepared for labor and delivery.  Experiencing pain or cramping in the lower abdomen or low back is a good sign that you should stop exercising. Check with your health care provider before continuing normal exercises.  Try to avoid standing for long periods of time. Move your legs often if you must stand in one place for a long time.  Avoid heavy lifting.  Wear low-heeled shoes, and practice good posture.  You may continue to have sex unless your health care provider directs you otherwise. Relief of Pain or Discomfort   Wear a good support bra for breast tenderness.   Take warm sitz baths to soothe any pain or discomfort caused by hemorrhoids. Use hemorrhoid cream if your health care provider approves.   Rest with your legs elevated if you have leg cramps or low back pain.  If you develop varicose veins in your legs, wear support hose. Elevate your feet for 15 minutes, 3-4 times a day. Limit salt in your diet. Prenatal Care   Schedule your prenatal visits by the twelfth week of pregnancy. They are usually scheduled monthly at first, then more often in the last 2 months before delivery.  Write down your questions. Take them to your prenatal visits.  Keep all your prenatal visits as directed  by your health care provider. Safety   Wear your seat belt at all times when driving.  Make a list of emergency phone numbers, including numbers for family, friends, the hospital, and police and fire departments. General Tips   Ask your health care provider for a referral to a local prenatal education class. Begin classes no later than at the beginning of month 6 of your pregnancy.  Ask for help if you have counseling or nutritional needs during pregnancy. Your health care provider can offer advice or refer you to specialists for help with various needs.  Do not use hot tubs, steam rooms, or saunas.  Do not douche or use tampons or scented sanitary pads.  Do not cross your legs for long periods of time.  Avoid cat litter boxes and soil used by cats. These carry germs that can cause birth defects in the baby and possibly loss of the fetus by miscarriage or stillbirth.  Avoid all smoking, herbs, alcohol, and medicines not prescribed by your health care provider. Chemicals in these affect the formation and growth of the baby.  Do not use any tobacco products, including cigarettes, chewing tobacco, and electronic cigarettes. If you need help quitting, ask your health care provider. You may receive counseling support and other resources to help you quit.  Schedule a dentist appointment. At home, brush your teeth with a soft toothbrush and be gentle when you floss. SEEK MEDICAL CARE IF:   You have dizziness.  You have mild pelvic cramps, pelvic pressure, or nagging pain in the abdominal area.  You have persistent nausea, vomiting, or diarrhea.  You have a bad smelling vaginal discharge.  You have pain with urination.  You notice increased swelling in your face, hands, legs, or ankles. SEEK IMMEDIATE MEDICAL CARE IF:   You have a fever.  You are leaking fluid from your vagina.  You have spotting or bleeding from your vagina.  You have severe abdominal cramping or pain.  You  have rapid weight gain or loss.  You vomit blood or material that looks like coffee grounds.  You are exposed to [place] measles and have never had them.  You are exposed to fifth disease or chickenpox.  You develop a severe headache.  You have shortness of breath.  You have any kind of trauma, such as from a fall or a car accident. This information is not intended to replace advice given to you by your health care provider. Make sure you discuss any questions you have with your health care provider. Document Released: 02/27/2001 Document Revised: 03/26/2014 Document Reviewed: 01/13/2013 Elsevier Interactive Patient Education  2017 [employer].

## 2016-02-16 NOTE — Progress Notes (Signed)
Patient still having difficulty with nausea/vomiting thus eating & loss of appetite Please review diclegis instructions, patient takes as needed not every day Patient plans to return to [place] 04/02/16 for remainder of pregnancy/delivery  Scheduled 1st screen

## 2016-02-16 NOTE — Progress Notes (Signed)
Subjective:  Sara Burke is a 35 y.o. G1P0000 at [redacted]w[redacted]d being seen today for first OB visit. She reports some problems with N/V but taking Diclegis. First pregnancy. No chronic medical problems.   She is currently monitored for the following issues for this low-risk pregnancy and has Supervision of low-risk pregnancy and Language barrier on her problem list.  Patient reports nausea.  Contractions: Not present. Vag. Bleeding: None.  Movement: Absent. Denies leaking of fluid.   The following portions of the patient's history were reviewed and updated as appropriate: allergies, current medications, past family history, past medical history, past social history, past surgical history and problem list. Problem list updated.  Objective:   Vitals:   02/16/16 1009 02/16/16 1011  BP: 101/71   Pulse: 63   Weight: 141 lb (64 kg)   Height:  5\' 4"  (1.626 m)    Fetal Status:     Movement: Absent     General:  Alert, oriented and cooperative. Patient is in no acute distress.  Skin: Skin is warm and dry. No rash noted.   Cardiovascular: Normal heart rate noted  Respiratory: Normal respiratory effort, no problems with respiration noted  Abdomen: Soft, gravid, appropriate for gestational age. Pain/Pressure: Absent     Pelvic:  Cervical exam performed        Extremities: Normal range of motion.  Edema: None  Mental Status: Normal mood and affect. Normal behavior. Normal judgment and thought content.   Urinalysis:      Assessment and Plan:  Pregnancy: G1P0000 at [redacted]w[redacted]d  1. Encounter for supervision of low-risk pregnancy in first trimester  - Prenatal Profile - Hemoglobinopathy Evaluation - Cystic fibrosis diagnostic study - Cytology - PAP - Culture, OB Urine - Pain Mgmt, Profile 6 Conf w/o mM, U - Flu Vaccine QUAD 36+ mos IM (Fluarix, Quad PF) - [place] MFM Fetal Nuchal Translucency; Future Diet modification reviewed. Reviewed increasing Diclegis as well for N/V Continue with PNV Will be moving  back to [place] in Jan 2018 2. Language barrier [place]. Husband speaks English Interrupter was used for [employer] today  Preterm labor symptoms and general obstetric precautions including but not limited to vaginal bleeding, contractions, leaking of fluid and fetal movement were reviewed in detail with the patient. Please refer to After Visit Summary for other counseling recommendations.  No Follow-up on file.   Chancy Milroy, MD

## 2016-02-17 LAB — PRENATAL PROFILE (SOLSTAS)
ANTIBODY SCREEN: NEGATIVE
BASOS PCT: 1 %
Basophils Absolute: 63 cells/uL (ref 0–200)
EOS ABS: 126 {cells}/uL (ref 15–500)
Eosinophils Relative: 2 %
HEMATOCRIT: 37.7 % (ref 35.0–45.0)
HIV: NONREACTIVE
Hemoglobin: 12.3 g/dL (ref 11.7–15.5)
Hepatitis B Surface Ag: NEGATIVE
LYMPHS PCT: 22 %
Lymphs Abs: 1386 cells/uL (ref 850–3900)
MCH: 30.3 pg (ref 27.0–33.0)
MCHC: 32.6 g/dL (ref 32.0–36.0)
MCV: 92.9 fL (ref 80.0–100.0)
MONO ABS: 441 {cells}/uL (ref 200–950)
MONOS PCT: 7 %
MPV: 9.8 fL (ref 7.5–12.5)
NEUTROS ABS: 4284 {cells}/uL (ref 1500–7800)
Neutrophils Relative %: 68 %
PLATELETS: 275 10*3/uL (ref 140–400)
RBC: 4.06 MIL/uL (ref 3.80–5.10)
RDW: 13.3 % (ref 11.0–15.0)
RH TYPE: POSITIVE
Rubella: 13.8 Index — ABNORMAL HIGH (ref <0.90)
WBC: 6.3 10*3/uL (ref 3.8–10.8)

## 2016-02-18 LAB — PAIN MGMT, PROFILE 6 CONF W/O MM, U
6 Acetylmorphine: NEGATIVE ng/mL (ref <10)
ALCOHOL METABOLITES: NEGATIVE ng/mL (ref <500)
AMPHETAMINES: NEGATIVE ng/mL (ref <500)
BARBITURATES: NEGATIVE ng/mL (ref <300)
Benzodiazepines: NEGATIVE ng/mL (ref <100)
CREATININE: 347.1 mg/dL (ref >=20.0)
Cocaine Metabolite: NEGATIVE ng/mL (ref <150)
METHADONE METABOLITE: NEGATIVE ng/mL (ref <100)
Marijuana Metabolite: NEGATIVE ng/mL (ref <20)
OPIATES: NEGATIVE ng/mL (ref <100)
OXIDANT: NEGATIVE ug/mL (ref <200)
Oxycodone: NEGATIVE ng/mL (ref <100)
PH: 6.74 (ref 4.5–9.0)
Phencyclidine: NEGATIVE ng/mL (ref <25)
Please note:: 0

## 2016-02-18 LAB — CULTURE, OB URINE

## 2016-02-20 LAB — CYTOLOGY - PAP
Adequacy: ABSENT
Chlamydia: NEGATIVE
Diagnosis: NEGATIVE
HPV (WINDOPATH): NOT DETECTED
Neisseria Gonorrhea: NEGATIVE

## 2016-02-20 LAB — HEMOGLOBINOPATHY EVALUATION
HCT: 37.7 % (ref 35.0–45.0)
HEMOGLOBIN: 12.3 g/dL (ref 11.7–15.5)
HGB A2 QUANT: 2.5 % (ref 1.8–3.5)
Hgb A: 96.5 % (ref >96.0)
MCH: 30.3 pg (ref 27.0–33.0)
MCV: 92.9 fL (ref 80.0–100.0)
RBC: 4.06 MIL/uL (ref 3.80–5.10)
RDW: 13.3 % (ref 11.0–15.0)

## 2016-02-21 ENCOUNTER — Encounter (HOSPITAL_COMMUNITY): Payer: Self-pay | Admitting: Obstetrics and Gynecology

## 2016-02-21 ENCOUNTER — Encounter: Payer: Managed Care, Other (non HMO) | Admitting: Family Medicine

## 2016-02-21 LAB — CYSTIC FIBROSIS DIAGNOSTIC STUDY

## 2016-02-29 ENCOUNTER — Ambulatory Visit (HOSPITAL_COMMUNITY)
Admission: RE | Admit: 2016-02-29 | Discharge: 2016-02-29 | Disposition: A | Discharge status: Home / Self Care | Payer: Managed Care, Other (non HMO) | Source: Ambulatory Visit | Attending: Obstetrics and Gynecology | Admitting: Obstetrics and Gynecology

## 2016-02-29 ENCOUNTER — Encounter (HOSPITAL_COMMUNITY): Payer: Self-pay

## 2016-02-29 DIAGNOSIS — Z3A12 12 weeks gestation of pregnancy: Secondary | ICD-10-CM | POA: Insufficient documentation

## 2016-02-29 DIAGNOSIS — Z3491 Encounter for supervision of normal pregnancy, unspecified, first trimester: Secondary | ICD-10-CM

## 2016-02-29 NOTE — Addendum Note (Signed)
Encounter addended by: Jill Poling, RT on: 02/29/2016 12:54 PM<BR>    Actions taken: Imaging Exam ended

## 2016-03-02 ENCOUNTER — Other Ambulatory Visit: Payer: Self-pay | Admitting: Nurse Practitioner

## 2016-03-14 ENCOUNTER — Ambulatory Visit (INDEPENDENT_AMBULATORY_CARE_PROVIDER_SITE_OTHER): Payer: Managed Care, Other (non HMO) | Admitting: Obstetrics and Gynecology

## 2016-03-14 VITALS — BP 109/74 | HR 74 | Wt 141.5 lb

## 2016-03-14 DIAGNOSIS — Z1379 Encounter for other screening for genetic and chromosomal anomalies: Secondary | ICD-10-CM

## 2016-03-14 DIAGNOSIS — Z3492 Encounter for supervision of normal pregnancy, unspecified, second trimester: Secondary | ICD-10-CM

## 2016-03-14 DIAGNOSIS — Z789 Other specified health status: Secondary | ICD-10-CM

## 2016-03-14 NOTE — Progress Notes (Signed)
[place] interpreter [phone] used for visit

## 2016-03-14 NOTE — Progress Notes (Signed)
Prenatal Visit Note Date: 03/14/2016 Clinic: [facility]  Subjective:  Sara Burke is a 35 y.o. G1P0000 at [redacted]w[redacted]d being seen today for ongoing prenatal care.  She is currently monitored for the following issues for this low-risk pregnancy and has Supervision of low-risk pregnancy and Language barrier on her problem list.  Patient reports no complaints.   Contractions: Not present. Vag. Bleeding: None.  Movement: Absent. Denies leaking of fluid.   The following portions of the patient's history were reviewed and updated as appropriate: allergies, current medications, past family history, past medical history, past social history, past surgical history and problem list. Problem list updated.  Objective:   Vitals:   03/14/16 1026  BP: 109/74  Pulse: 74  Weight: 141 lb 8 oz (64.2 kg)    Fetal Status: Fetal Heart Rate (bpm): 150   Movement: Absent     General:  Alert, oriented and cooperative. Patient is in no acute distress.  Skin: Skin is warm and dry. No rash noted.   Cardiovascular: Normal heart rate noted  Respiratory: Normal respiratory effort, no problems with respiration noted  Abdomen: Soft, gravid, appropriate for gestational age. Pain/Pressure: Present     Pelvic:  Cervical exam deferred        Extremities: Normal range of motion.  Edema: None  Mental Status: Normal mood and affect. Normal behavior. Normal judgment and thought content.   Urinalysis:      Assessment and Plan:  Pregnancy: G1P0000 at [redacted]w[redacted]d  1. Encounter for supervision of low-risk pregnancy in second trimester Pt okay for AFP. Pt told to come back in one week for lab only visit She would also like cffdna. R/b/a d/w her and she would still like to do it even though she's low risk (EDC is a few days before she turns 36). Normal 1st trimester screen already. Records given to patient as she is moving to [place] in January.   2. Language barrier Interpreter used  3. Encounter for genetic  screening - AMB referral to maternal fetal medicine  Preterm labor symptoms and general obstetric precautions including but not limited to vaginal bleeding, contractions, leaking of fluid and fetal movement were reviewed in detail with the patient. Please refer to After Visit Summary for other counseling recommendations.  Return in about 1 week (around 03/21/2016) for lab only visit for AFP. 3wks rob.   Aletha Halim, MD

## 2016-03-15 ENCOUNTER — Ambulatory Visit (HOSPITAL_COMMUNITY)
Admission: RE | Admit: 2016-03-15 | Discharge: 2016-03-15 | Disposition: A | Discharge status: Home / Self Care | Payer: Managed Care, Other (non HMO) | Source: Ambulatory Visit | Attending: Obstetrics and Gynecology | Admitting: Obstetrics and Gynecology

## 2016-03-15 DIAGNOSIS — Z8279 Family history of other congenital malformations, deformations and chromosomal abnormalities: Secondary | ICD-10-CM | POA: Insufficient documentation

## 2016-03-15 DIAGNOSIS — Z1379 Encounter for other screening for genetic and chromosomal anomalies: Secondary | ICD-10-CM | POA: Diagnosis present

## 2016-03-15 NOTE — Progress Notes (Signed)
Genetic Counseling  High-Risk Gestation Note  Appointment Date:  03/15/2016 Referred By: Aletha Halim, MD Date of Birth:  07/08/1980   Pregnancy History: [id number] Estimated Date of Delivery: 09/09/16 Estimated Gestational Burke: [redacted]w[redacted]d Attending: Renella Cunas, MD    Sara Burke was seen for genetic counseling to discuss available testing options. [facility] medical telephonic interpreter [phone] and then [phone] provided interpretation during today's visit.   In summary:  Discussed Burke related risk for fetal aneuploidy  Reviewed results of previous Burke  First trimester Burke - wnl (< 1 in 10,000 risk for T21, T18/T13)  Discussed options for Burke  AFP only screen  NIPS - elected to pursue today  Ultrasound - available at approximately [redacted] weeks gestation. Patient plans to move back to [place] on January 19  Discussed diagnostic testing options  Amniocentesis- declined  Reviewed family history concerns  Paternal first cousin once removed to patient with Down syndrome  Reviewed that report history most consistent with sporadic occurrence, but additional information may alter recurrence risk    We began by reviewing Sara Burke's medical history. She is a 35 y.o. gravida 1. She has no other health concerns and denied exposure to environmental toxins or chemical agents. She denied the use of alcohol, tobacco or street drugs. She denied significant viral illnesses during the course of her pregnancy.   Both family histories were reviewed and found to be contributory for a paternal first cousin to the patient who had a child with Down syndrome. The patient had limited information regarding this relative, given that the child died at less than a year of Burke due to heart trouble. No information was known regarding the specific karyotype. We discussed that 95% of cases of Down syndrome are not inherited and are the result of non-disjunction.  Three to 4% of cases  of Down syndrome are the result of a translocation involving chromosome #21.  We discussed the option of chromosome analysis to determine if an individual is a carrier of a balanced translocation involving chromosome #21.  If an individual carries a balanced translocation involving chromosome #21, then the chance to have a baby with Down syndrome would be greater than the maternal Burke-related risk. The reported family history is most suggestive of sporadically occurring Down syndrome, which would not be expected to impact the patient's risk for Down syndrome in offspring.  Additional information regarding this relative's history may alter recurrence risk assessment. The family histories were otherwise unremarkable for birth defects, intellectual disability, recurrent pregnancy loss, and known genetic conditions. Without further information regarding the provided family history, an accurate genetic risk cannot be calculated. Further genetic counseling is warranted if more information is obtained.  We then discussed routine Burke options. Sara Burke specifically inquired about NIPT (noninvasive prenatal testing). She was counseled regarding the availability of Burke for fetal aneuploidy. Considering Sara Burke of 35 y.o., we discussed that she is considered to be in the low risk category for having a fetus with aneuploidy. We reviewed chromosomes, nondisjunction, and the associated 1 in 300 risk for fetal aneuploidy related to a maternal Burke of 35 y.o. at [redacted]w[redacted]d gestation. She was counseled that the risk for aneuploidy decreases as gestational Burke increases, accounting for those pregnancies which spontaneously abort. We briefly discussed common fetal chromosome conditions including the features and prognoses of each.   Sara Burke, which was within normal range. We reviewed the components of this screen, the detection rates, and the associated  reduction in risks for fetal Down syndrome (1 in 309 to less than 1 in 10,000) and trisomy 18/13 (1 in 567 to less than 1 in 10,000). We also reviewed benefits and limitations of targeted ultrasound as a Burke tool for fetal aneuploidy in pregnancy. She was counseled that Burke tests are used to modify a patient's a priori risk for aneuploidy, typically based on Burke. This estimate provides a pregnancy specific risk assessment. We reviewed the benefits and limitations of each option. Specifically, we discussed the conditions for which each test screens, the detection rates, and false positive rates of each. We also discussed noninvasive prenatal Burke (NIPS)/cell free DNA (cfDNA) Burke, also referred to as NIPT, and diagnostic testing via amniocentesis. We discussed that while NIPS and diagnostic testing should be made available to all pregnant patients, ACOG recommends that this testing be offered to those patients who are considered to have a "high" risk for fetal aneuploidy (i.e. those who are AMA, have an abnormal maternal serum or combined Burke result, have an abnormal finding by fetal ultrasound, or have a family history of a specific chromosome condition). We discussed the possible results that the tests might provide including: positive, negative, unanticipated, and no result. Finally, they were counseled regarding the cost of each option and potential out of pocket expenses.   After consideration of all the options, she elected to pursue NIPS ([facility] through [facility]), which was performed today. She declined amniocentesis. We discussed that results of NIPS will be available in approximately 7-10 days. She understands that Burke tests cannot rule out all birth defects or genetic syndromes. The patient was advised of this limitation. Sara Burke plans to move back to [place] on January 19. She plans to have follow-up OB care in [place] after that time.   I counseled Sara Burke regarding the above risks and available options. The approximate face-to-face time with the genetic counselor was 50 minutes.  Chipper Oman, MS,  Certified Genetic Counselor 03/15/2016

## 2016-03-21 ENCOUNTER — Other Ambulatory Visit: Payer: Managed Care, Other (non HMO)

## 2016-03-21 DIAGNOSIS — Z3492 Encounter for supervision of normal pregnancy, unspecified, second trimester: Secondary | ICD-10-CM

## 2016-03-21 DIAGNOSIS — Z1379 Encounter for other screening for genetic and chromosomal anomalies: Secondary | ICD-10-CM

## 2016-03-22 ENCOUNTER — Telehealth (HOSPITAL_COMMUNITY): Payer: Self-pay | Admitting: MS"

## 2016-03-22 ENCOUNTER — Other Ambulatory Visit: Payer: Self-pay

## 2016-03-22 LAB — ALPHA FETOPROTEIN, MATERNAL
AFP: 71.3 ng/mL
Curr Gest Age: 15.4 weeks
MoM for AFP: 1.94
OPEN SPINA BIFIDA: NEGATIVE

## 2016-03-22 NOTE — Telephone Encounter (Signed)
Called Sara Burke via [facility] interpreter [phone] to discuss her prenatal cell free DNA test results.  Mrs. Sara Burke had Panorama testing through [facility].  Testing was offered because of patient request and family history of Down syndrome.   The patient was identified by name and DOB.  We reviewed that these are within normal limits, showing a less than 1 in 10,000 risk for trisomies 21, 18 and 13, and monosomy X (Turner syndrome).  In addition, the risk for triploidy and sex chromosome trisomies (47,XXX and 47,XXY) was also low risk.  We reviewed that this testing identifies > 99% of pregnancies with trisomy 19, trisomy 31, sex chromosome trisomies (47,XXX and 47,XXY), and triploidy. The detection rate for trisomy 18 is 98%.  The detection rate for monosomy X is ~94%.  The false positive rate is <0.1% for all conditions. Testing was also consistent with female fetal sex.  The patient did wish to know fetal sex.  She understands that this testing does not identify all genetic conditions.  All questions were answered to her satisfaction, she was encouraged to call with additional questions or concerns. A copy of the report is being released to the patient via [employer] portal, per the patient's request given her upcoming move to [place].   Chipper Oman, MS Insurance risk surveyor

## 2016-04-04 ENCOUNTER — Encounter: Payer: Managed Care, Other (non HMO) | Admitting: Advanced Practice Midwife

## 2016-04-05 ENCOUNTER — Telehealth: Payer: Self-pay | Admitting: Advanced Practice Midwife

## 2016-04-05 NOTE — Telephone Encounter (Signed)
Patient husband called and stated they were moving back to [place].

## 2016-11-06 ENCOUNTER — Encounter (HOSPITAL_COMMUNITY): Payer: Self-pay

## 2018-10-01 IMAGING — US US OB TRANSVAGINAL
1 series · 15 of 28 positions shown · non-contrast
Comparison: None.

CLINICAL DATA: Pelvic pain on the right

EXAM:
OBSTETRIC <14 WK US AND TRANSVAGINAL OB US
TECHNIQUE: Both transabdominal and transvaginal ultrasound examinations were
performed for complete evaluation of the gestation as well as the
maternal uterus, adnexal regions, and pelvic cul-de-sac.
Transvaginal technique was performed to assess early pregnancy.

[Series 1: us ob transvaginal · 15 of 38 slices shown]
[im 1/38]
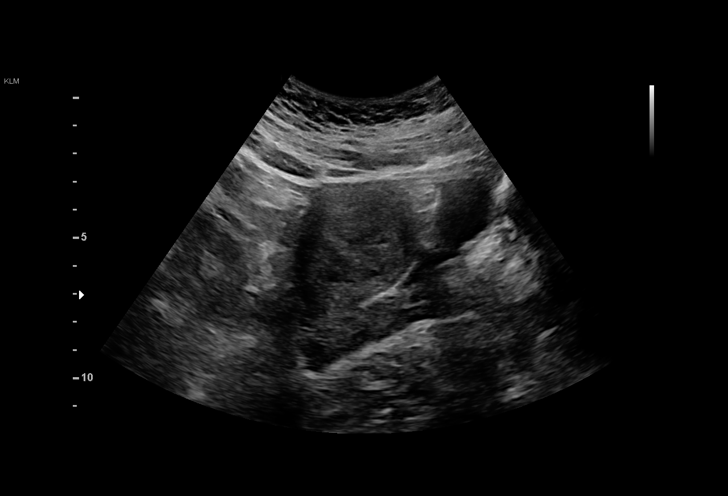
[im 3/38]
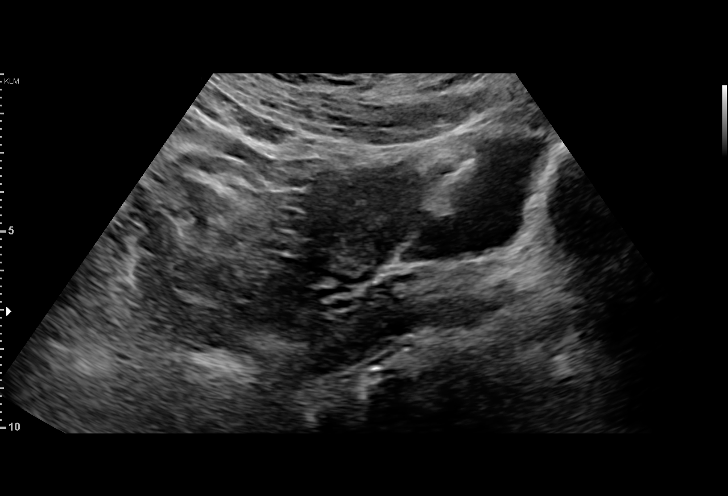
[im 6/38]
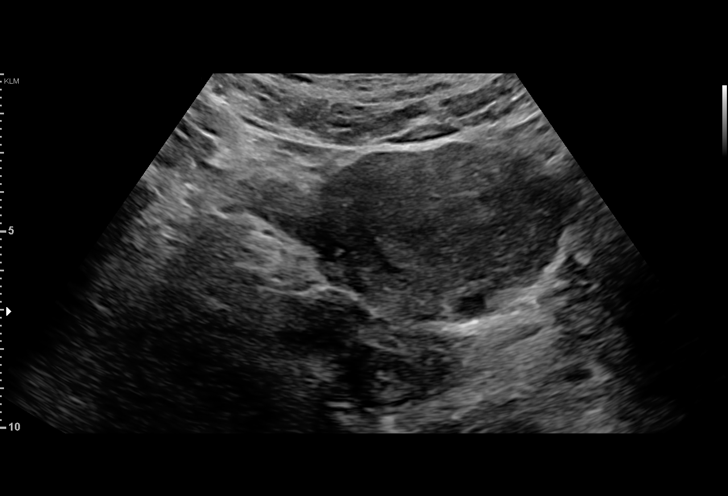
[im 9/38]
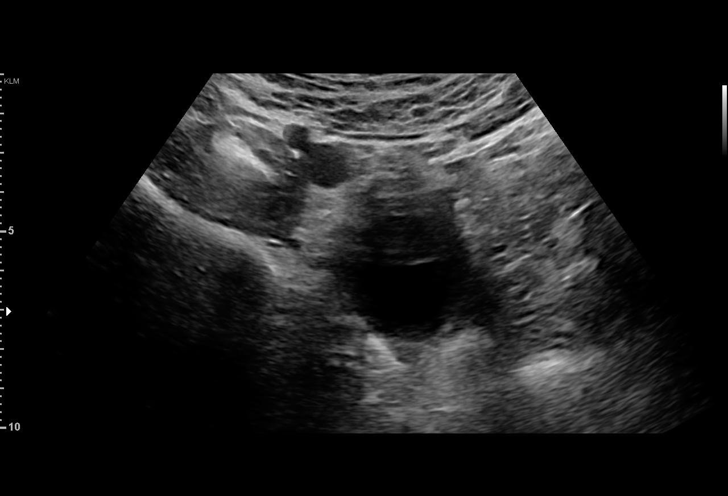
[im 11/38]
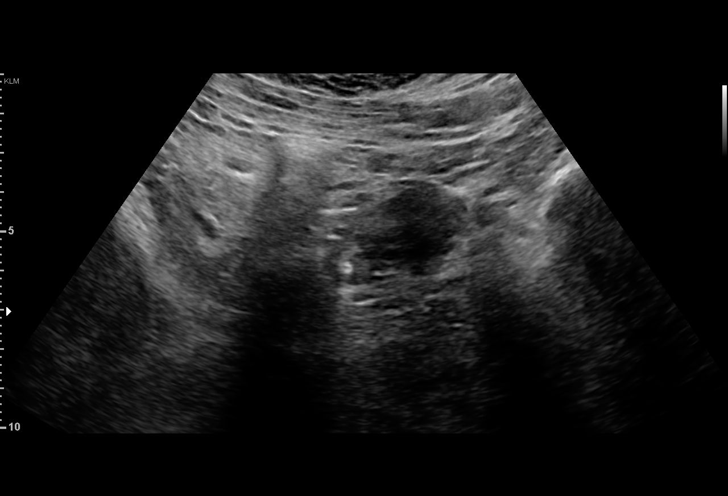
[im 14/38]
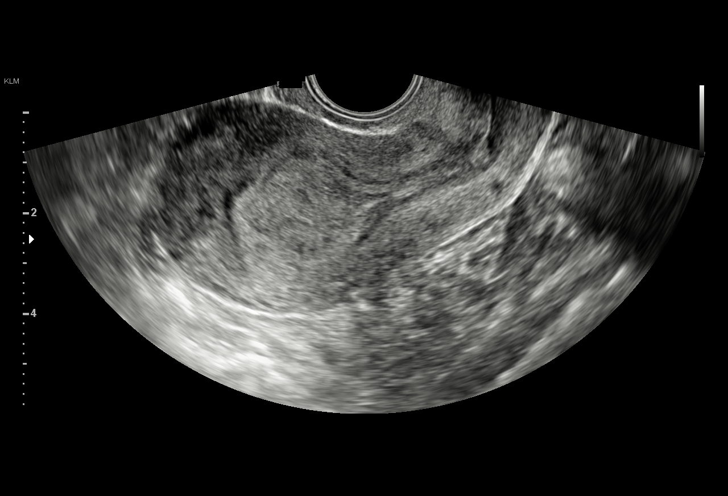
[im 17/38]
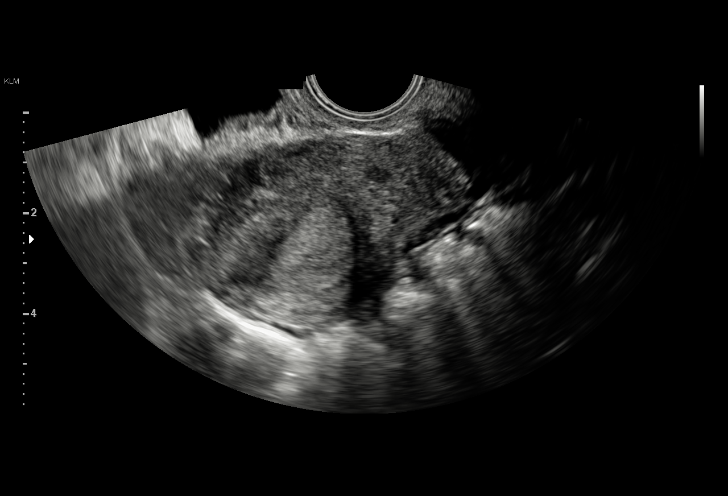
[im 20/38]
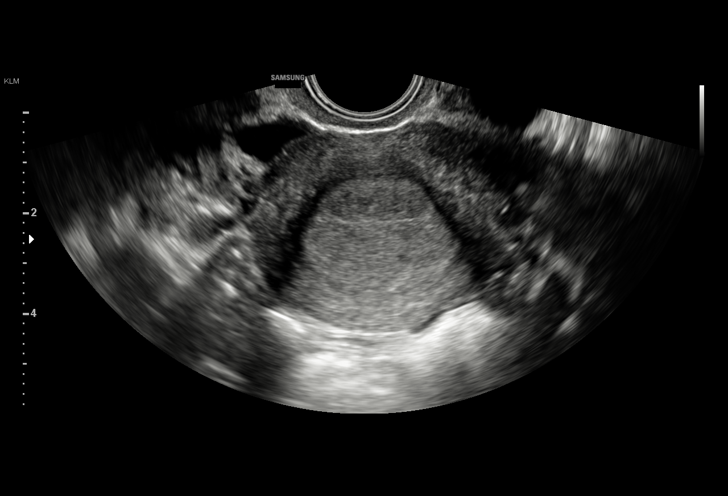
[im 21/38]
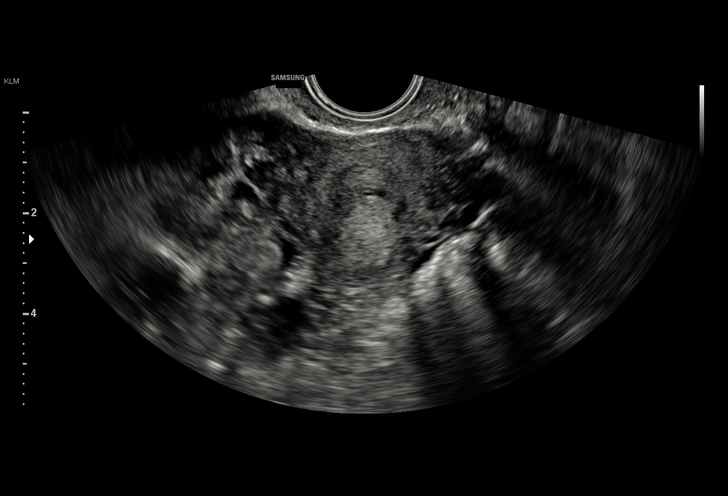
[im 24/38]
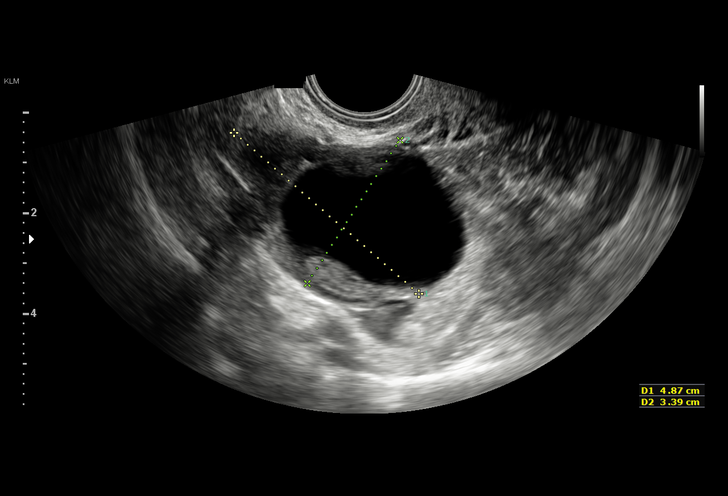
[im 27/38]
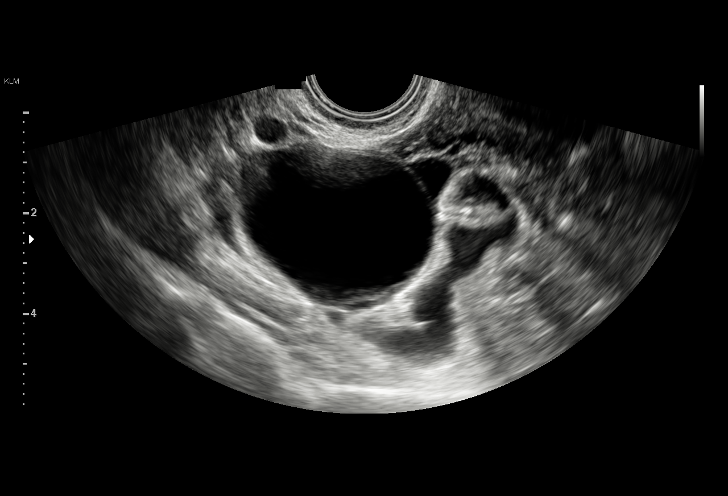
[im 29/38]
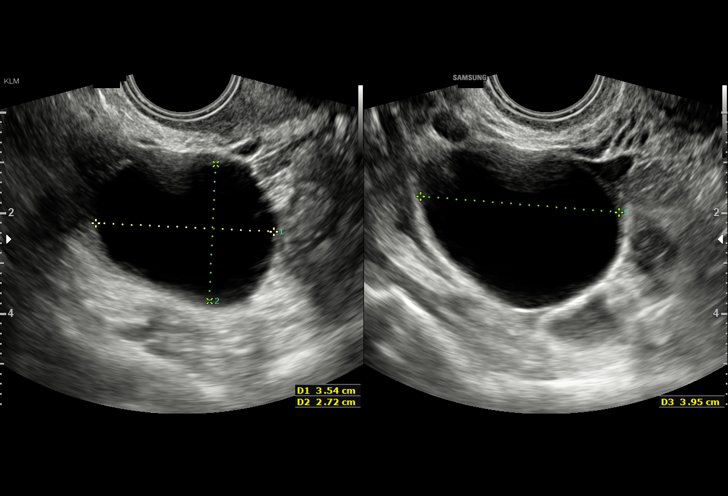
[im 32/38]
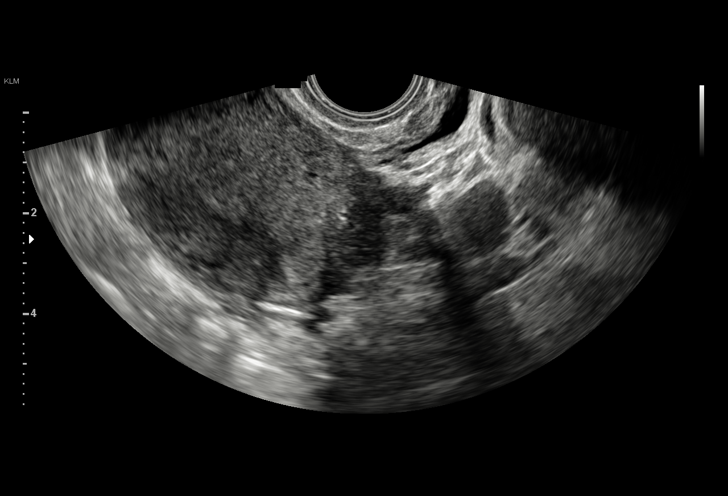
[im 35/38]
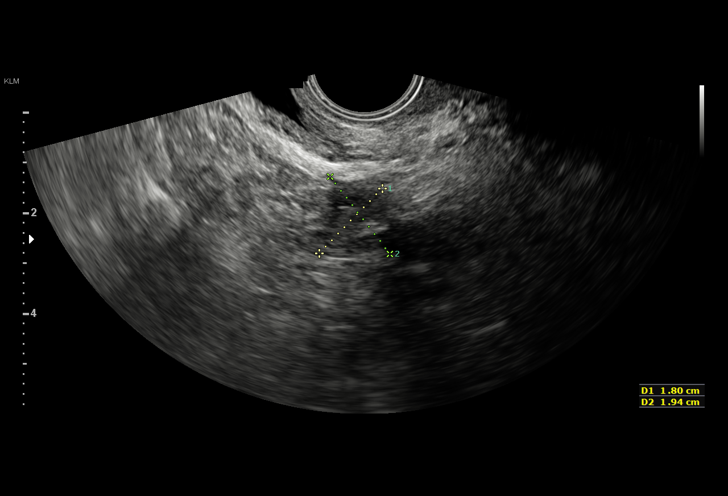
[im 38/38]
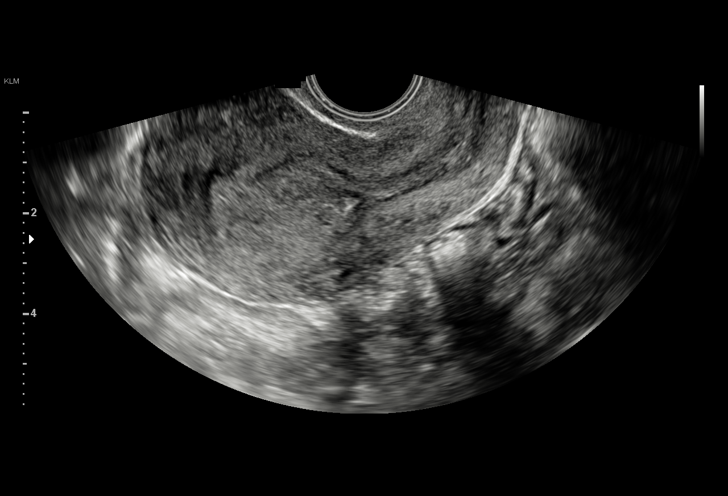

[15 of 28 positions shown; findings below may reference images not displayed]

FINDINGS: Intrauterine gestational sac: Absent

Maternal uterus/adnexae: Endometrial thickening is noted consistent
with positive pregnancy test.

The left ovary is within normal limits. The right ovary is enlarged
with enlarged 3.5 cm cyst within.
IMPRESSION: Prominent right ovarian cyst.

Thickening of the endometrium consistent with a positive pregnancy
test although no gestational sac is noted at this time. Recommend
follow-up quantitative B-HCG levels and follow-up US in 14 days as
clinically indicated. This recommendation follows SRU consensus
guidelines: Diagnostic Criteria for Nonviable Pregnancy Early in the
First Trimester. N Engl J Med 8988; [DATE].
